# Patient Record
Sex: Male | Born: 2000 | Race: White | Hispanic: No | Marital: Single | Smoking: Never smoker
Health system: Southern US, Community
[De-identification: ages and names within clinical notes are randomized; demographics above are authoritative.]

## PROBLEM LIST (undated history)

## (undated) DIAGNOSIS — K589 Irritable bowel syndrome without diarrhea: Secondary | ICD-10-CM

## (undated) HISTORY — DX: Irritable bowel syndrome, unspecified: K58.9

## (undated) HISTORY — PX: APPENDECTOMY: SHX54

---
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
## 2022-01-18 ENCOUNTER — Institutional Professional Consult (permissible substitution): Payer: Medicaid Other | Admitting: Family

## 2022-01-20 ENCOUNTER — Ambulatory Visit (INDEPENDENT_AMBULATORY_CARE_PROVIDER_SITE_OTHER): Payer: Medicaid Other | Admitting: Family

## 2022-01-20 ENCOUNTER — Encounter: Payer: Self-pay | Admitting: Family

## 2022-01-20 VITALS — BP 118/72 | HR 68 | Resp 16 | Ht 71.0 in | Wt 200.6 lb

## 2022-01-20 DIAGNOSIS — F900 Attention-deficit hyperactivity disorder, predominantly inattentive type: Secondary | ICD-10-CM | POA: Diagnosis not present

## 2022-01-20 DIAGNOSIS — Z7189 Other specified counseling: Secondary | ICD-10-CM

## 2022-01-20 DIAGNOSIS — R4589 Other symptoms and signs involving emotional state: Secondary | ICD-10-CM | POA: Diagnosis not present

## 2022-01-20 DIAGNOSIS — Z79899 Other long term (current) drug therapy: Secondary | ICD-10-CM

## 2022-01-20 DIAGNOSIS — F411 Generalized anxiety disorder: Secondary | ICD-10-CM

## 2022-01-20 DIAGNOSIS — Z719 Counseling, unspecified: Secondary | ICD-10-CM

## 2022-01-20 MED ORDER — ATOMOXETINE HCL 10 MG PO CAPS: 10.0000 mg | ORAL_CAPSULE | Freq: Every day | ORAL | 2 refills | Status: DC

## 2022-01-20 NOTE — Patient Instructions (Addendum)
Strattera to start with 10 mg for 5 days, increase to 2 of the 10 mg capsules for 5 days and then increase to 3 of the 10 mg capsules. Before the medication is out call the office for the 40 mg dose. Take the medication at the same time at night.   [email or web address]
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
## 2022-01-21 ENCOUNTER — Encounter: Payer: Self-pay | Admitting: Family

## 2022-02-02 ENCOUNTER — Telehealth: Payer: Self-pay | Admitting: Family

## 2022-02-02 NOTE — Telephone Encounter (Signed)
  Emailed letter to [employer] ([email or web address] .[phone])

## 2022-02-04 ENCOUNTER — Other Ambulatory Visit: Payer: Self-pay

## 2022-02-04 MED ORDER — ATOMOXETINE HCL 40 MG PO CAPS: 40.0000 mg | ORAL_CAPSULE | Freq: Every day | ORAL | 1 refills | Status: DC

## 2022-02-04 NOTE — Telephone Encounter (Signed)
Strattera 40 mg daily # 30 with 1 RF's.Malena Peer for above e-scribed and sent to pharmacy on record  [id number] [place], [place] - [address] AT [facility] OF [facility] [address] [place] [address] Phone: [phone] Fax: [phone]

## 2022-04-16 ENCOUNTER — Other Ambulatory Visit: Payer: Self-pay | Admitting: Family

## 2022-04-18 NOTE — Telephone Encounter (Signed)
Strattera 40 mg daily, #30 with 1 RF's.RX for above e-scribed and sent to pharmacy on record  [id number] - [place], [place] - [address] AT [address] OF [address]  [place] [address] Phone: [phone] Fax: [phone]     

## 2022-05-26 ENCOUNTER — Telehealth (INDEPENDENT_AMBULATORY_CARE_PROVIDER_SITE_OTHER): Payer: Medicaid Other | Admitting: Family

## 2022-05-26 ENCOUNTER — Encounter: Payer: Self-pay | Admitting: Family

## 2022-05-26 DIAGNOSIS — F819 Developmental disorder of scholastic skills, unspecified: Secondary | ICD-10-CM

## 2022-05-26 DIAGNOSIS — Z719 Counseling, unspecified: Secondary | ICD-10-CM

## 2022-05-26 DIAGNOSIS — R278 Other lack of coordination: Secondary | ICD-10-CM

## 2022-05-26 DIAGNOSIS — Z79899 Other long term (current) drug therapy: Secondary | ICD-10-CM

## 2022-05-26 DIAGNOSIS — F900 Attention-deficit hyperactivity disorder, predominantly inattentive type: Secondary | ICD-10-CM

## 2022-05-26 DIAGNOSIS — F411 Generalized anxiety disorder: Secondary | ICD-10-CM

## 2022-05-26 DIAGNOSIS — Z7189 Other specified counseling: Secondary | ICD-10-CM

## 2022-05-26 DIAGNOSIS — R4589 Other symptoms and signs involving emotional state: Secondary | ICD-10-CM | POA: Diagnosis not present

## 2022-05-26 MED ORDER — SERTRALINE HCL 100 MG PO TABS: 100.0000 mg | ORAL_TABLET | Freq: Every day | ORAL | 2 refills | Status: DC

## 2022-05-26 NOTE — Progress Notes (Signed)
[place] [facility] [address], [place] [place] [address] Dept: [phone] Dept Fax: [phone]  Medication Check visit via Virtual Video   Patient ID:  Tyrone Carpenter  male DOB: [birth year]   21 y.o.   MRN: [id number]   DATE:05/26/22  PCP: Estrella Myrtle, MD  Virtual Visit via Video Note  I connected with  Tyrone Carpenter on 05/26/22 at  1:00 PM EDT by a video enabled telemedicine application and verified that I am speaking with the correct person using two identifiers. Patient/Parent Location: at work in his parked car.   I discussed the limitations, risks, security and privacy concerns of performing an evaluation and management service by telephone and the availability of in person appointments. I also discussed with the parents that there may be a patient responsible charge related to this service. The parents expressed understanding and agreed to proceed.  Provider: Carron Curie, NP  Location: work location.   HPI/CURRENT STATUS: Tyrone Carpenter is here for medication management of the psychoactive medications for ADHD and review of educational and behavioral concerns.   Tyrone Carpenter currently taking Strattera 40 mg and Zoloft 100 mg daily, which is working well. Takes medication daily as instructed. Medication tends to last for the time needed. Tyrone Carpenter is able to focus through school & work.   Tyrone Carpenter is eating well (eating breakfast, lunch and dinner). Tyrone Carpenter does not have appetite suppression and no changes reported.   Sleeping well (goes to bed at 2200-2300 wakes at 0600), sleeping through the night. Tyrone Carpenter does not have delayed sleep onset and getting plenty of sleep.   EDUCATION: School: [employer] with taking 2 classes right now Year/Grade:  college   Services: support class to start in October, disability services on campus Work: [employer] Hours: 7-2:00 pm on average (changes daily depending on night  before) Activities/ Exercise: intermittently and work has been busy, so he is staying active. Walking on occasion.   MEDICAL HISTORY: Individual Medical History/ Review of Systems: Yes, recent viral illness for a few days.  Has been healthy with no visits to the PCP. WCC due yearly.   Family Medical/ Social History: None reported Patient Lives with: grandparents  MENTAL HEALTH: Mental Health Issues: Anxiety less now with current medications with efficacy with symptom control.   Allergies: Allergies  Allergen Reactions   Amoxicillin Rash   Current Medications:  Current Outpatient Medications  Medication Instructions   atomoxetine (STRATTERA) 40 MG capsule TAKE 1 CAPSULE(40 MG) BY MOUTH DAILY   cetirizine (ZYRTEC) 10 MG tablet Oral   fluticasone (FLONASE) 50 MCG/ACT nasal spray 1 spray, Daily   pantoprazole (PROTONIX) 40 MG tablet Oral   sertraline (ZOLOFT) 100 mg, Oral, Daily  Medication Side Effects: None  DIAGNOSES:    ICD-10-CM   1. ADHD (attention deficit hyperactivity disorder), inattentive type  F90.0     2. Depressed mood  R45.89     3. Generalized anxiety disorder  F41.1     4. Learning difficulty  F81.9     5. Dysgraphia  R27.8     6. Medication management  Z79.899     7. Patient counseled  Z71.9     8. Goals of care, counseling/discussion  Z71.89      ASSESSMENT:     Tyrone Carpenter is a 21 year old male with a history of ADHD and Anxiety. He has been maintained on Strattera 40 mg and Zoloft 100 mg daily with good efficacy with no  side effects. Enrolled in [employer] with 2 main classes with 1 support class that starts in October. Attending 2 days/week for school. Has formal services in place and extra help when available, if needed. Working at [employer] working mostly mornings and 1 night shift weekly. No significant changes with eating, sleeping or health. Has been more active with work duties and lost some weight. No current need to change medications or dosing  today.  PLAN/RECOMMENDATIONS:  Updates with school and enrollment in 2 classes with 1 support class starting in October.  Spring semester will sign up in October and has to meet with disability services   Current routine for the day time with work and school discussed with good progress.  Tyrone Carpenter has current current disability services in place and has help available when needed.   Discussed work and scheduled days along with hours for school with requirements.  Eating less and staying more active with current work requirements.   Sleeping well with no problems but limited with working to "sleep in" mornings.  Medication management with current dosing and symptoms with good efficacy.   Counseled medication pharmacokinetics, options, dosage, administration, desired effects, and possible side effects.   Strattera 40 mg daily, no Rx today Zoloft changed to 100 mg daily, # 30 with 2 RF's RX for above e-scribed and sent to pharmacy on record  [place], [place] [address] Phone: [phone] Fax: [phone]  I discussed the assessment and treatment plan with the patient. The patient was provided an opportunity to ask questions and all were answered. The patient agreed with the plan and demonstrated an understanding of the instructions.   NEXT APPOINTMENT:  Visit date not found-f/u visit Telehealth OK  The patient was advised to call back or seek an in-person evaluation if the symptoms worsen or if the condition fails to improve as anticipated.  Carolann Littler, NP

## 2022-06-15 ENCOUNTER — Other Ambulatory Visit: Payer: Self-pay | Admitting: Family

## 2022-06-15 NOTE — Telephone Encounter (Signed)
Strattera 40 mg daily, #30 with 1 RF's.RX for above e-scribed and sent to pharmacy on record  [place], [place] [address] Phone: [phone] Fax: [phone]

## 2022-07-26 ENCOUNTER — Other Ambulatory Visit: Payer: Self-pay

## 2022-07-26 MED ORDER — ATOMOXETINE HCL 40 MG PO CAPS: ORAL_CAPSULE | ORAL | 1 refills | Status: DC

## 2022-07-26 MED ORDER — SERTRALINE HCL 100 MG PO TABS: 100.0000 mg | ORAL_TABLET | Freq: Every day | ORAL | 1 refills | Status: DC

## 2022-07-26 NOTE — Telephone Encounter (Signed)
Strattera 40 mg daily, #90 with 1 RF's and Zoloft 100 mg daily, #90 with 1 RF's.RX for above e-scribed and sent to pharmacy on record  [facility] [phone] [place], [place] - [address] [place] [address] Phone: [phone] Fax: [phone]

## 2022-08-14 ENCOUNTER — Other Ambulatory Visit: Payer: Self-pay | Admitting: Family

## 2022-09-13 ENCOUNTER — Telehealth: Payer: Self-pay

## 2022-09-13 ENCOUNTER — Institutional Professional Consult (permissible substitution): Payer: Self-pay | Admitting: Family

## 2022-09-13 NOTE — Telephone Encounter (Signed)
Patient not able to get off work for 09/13/2022 appointment with [facility]

## 2022-12-01 ENCOUNTER — Telehealth: Payer: Self-pay

## 2022-12-01 NOTE — Telephone Encounter (Signed)
Who's calling (name and relationship to patient) : Blanch Media; grandmother   Best contact number: [phone]  Provider they see:  Reason for call: Mom called and lvm. I called back to get [facility] scheduled. She wanted to know if we was able to fill Rx for Zoloft and his ADHD.   I did inform her that she would have to reach out to PCP. In which she stated that he is no longer there due to aging out, but will call them to see what they can do. She stated that she will call back if they are not able to help her.   Call ID:      PRESCRIPTION REFILL ONLY  Name of prescription:  Pharmacy:

## 2023-01-09 ENCOUNTER — Encounter (HOSPITAL_BASED_OUTPATIENT_CLINIC_OR_DEPARTMENT_OTHER): Payer: Self-pay | Admitting: Urology

## 2023-01-09 ENCOUNTER — Emergency Department (HOSPITAL_BASED_OUTPATIENT_CLINIC_OR_DEPARTMENT_OTHER)
Admission: EM | Admit: 2023-01-09 | Discharge: 2023-01-09 | Disposition: A | Discharge status: Home / Self Care | Payer: Medicaid Other | Attending: Emergency Medicine | Admitting: Emergency Medicine

## 2023-01-09 ENCOUNTER — Other Ambulatory Visit: Payer: Self-pay

## 2023-01-09 DIAGNOSIS — K921 Melena: Secondary | ICD-10-CM | POA: Insufficient documentation

## 2023-01-09 DIAGNOSIS — R195 Other fecal abnormalities: Secondary | ICD-10-CM

## 2023-01-09 LAB — COMPREHENSIVE METABOLIC PANEL
ALT: 16 U/L (ref 0–44)
AST: 20 U/L (ref 15–41)
Albumin: 4.8 g/dL (ref 3.5–5.0)
Alkaline Phosphatase: 78 U/L (ref 38–126)
Anion gap: 7 (ref 5–15)
BUN: 13 mg/dL (ref 6–20)
CO2: 28 mmol/L (ref 22–32)
Calcium: 8.9 mg/dL (ref 8.9–10.3)
Chloride: 103 mmol/L (ref 98–111)
Creatinine, Ser: 0.7 mg/dL (ref 0.61–1.24)
GFR, Estimated: >60 mL/min (ref >60)
Glucose, Bld: 88 mg/dL (ref 70–99)
Potassium: 3.2 mmol/L — ABNORMAL LOW (ref 3.5–5.1)
Sodium: 138 mmol/L (ref 135–145)
Total Bilirubin: 1.1 mg/dL (ref 0.3–1.2)
Total Protein: 8.1 g/dL (ref 6.5–8.1)

## 2023-01-09 LAB — CBC WITH DIFFERENTIAL/PLATELET
Abs Immature Granulocytes: 0.01 10*3/uL (ref 0.00–0.07)
Basophils Absolute: 0.1 10*3/uL (ref 0.0–0.1)
Basophils Relative: 2 %
Eosinophils Absolute: 0.1 10*3/uL (ref 0.0–0.5)
Eosinophils Relative: 2 %
HCT: 43.4 % (ref 39.0–52.0)
Hemoglobin: 15.2 g/dL (ref 13.0–17.0)
Immature Granulocytes: 0 %
Lymphocytes Relative: 29 %
Lymphs Abs: 1.4 10*3/uL (ref 0.7–4.0)
MCH: 29.1 pg (ref 26.0–34.0)
MCHC: 35 g/dL (ref 30.0–36.0)
MCV: 83.1 fL (ref 80.0–100.0)
Monocytes Absolute: 0.5 10*3/uL (ref 0.1–1.0)
Monocytes Relative: 10 %
Neutro Abs: 2.8 10*3/uL (ref 1.7–7.7)
Neutrophils Relative %: 57 %
Platelets: 275 10*3/uL (ref 150–400)
RBC: 5.22 MIL/uL (ref 4.22–5.81)
RDW: 11.9 % (ref 11.5–15.5)
WBC: 4.8 10*3/uL (ref 4.0–10.5)
nRBC: 0 % (ref 0.0–0.2)

## 2023-01-09 LAB — URINALYSIS, ROUTINE W REFLEX MICROSCOPIC
Bilirubin Urine: NEGATIVE
Glucose, UA: NEGATIVE mg/dL
Hgb urine dipstick: NEGATIVE
Ketones, ur: NEGATIVE mg/dL
Leukocytes,Ua: NEGATIVE
Nitrite: NEGATIVE
Protein, ur: NEGATIVE mg/dL
Specific Gravity, Urine: 1.02 (ref 1.005–1.030)
pH: 7 (ref 5.0–8.0)

## 2023-01-09 LAB — LIPASE, BLOOD: Lipase: 51 U/L (ref 11–51)

## 2023-01-09 LAB — OCCULT BLOOD X 1 CARD TO LAB, STOOL: Fecal Occult Bld: NEGATIVE

## 2023-01-09 NOTE — ED Triage Notes (Signed)
Pt states black stools x 2-3 days  Reports mild abdominal discomfort  States stool is formed  Denies N/V   States has lost 20-30 lb over the past month

## 2023-01-09 NOTE — Discharge Instructions (Addendum)
Note the workup today was overall reassuring.  As discussed, no evidence of blood and dark stool sample obtained.  Blood work looked reassuring.  Most likely secondary to Pepto-Bismol use.  Recommend follow-up with gastroenterology for reassessment of your symptoms.  Please do not hesitate to return to emergency department for worrisome signs or symptoms we discussed become apparent.

## 2023-01-09 NOTE — ED Provider Notes (Signed)
[place] [facility] Provider Note   CSN: [id number] Arrival date & time: 01/09/23  8295     History  Chief Complaint  Patient presents with   Melena    Tyrone Carpenter is a 22 y.o. male.  HPI   22 year old male presents emergency department with complaints of dark stool.  Patient states that he has been with consistent dark stool for the past 2 days.  Reports history of "GI bug" approximately 1 week ago..  States that he had symptoms of nausea and vomiting at that time.  States that he began to take Pepto-Bismol 2 days ago with subsequently noting dark stool.  Patient reports also weight loss over the past few months.  States has been working out more than normal without any change in diet.  He does state that he has noted some weight loss after been putting on atomoxetine approximately 1 year ago.  Reports some intermittent abdominal cramping type sensation of which she states he has had since he was "a child."  Denies any fever, chills, chest pain, shortness of breath, blood thinner use, urinary symptoms.  Past medical history significant for generalized anxiety disorder, ADHD  Home Medications Prior to Admission medications   Medication Sig Start Date End Date Taking? Authorizing Provider  atomoxetine (STRATTERA) 40 MG capsule TAKE 1 CAPSULE(40 MG) BY MOUTH DAILY 07/26/22   Paretta-Leahey, Miachel Roux, NP  cetirizine (ZYRTEC) 10 MG tablet Take by mouth.    [provider]  fluticasone (FLONASE) 50 MCG/ACT nasal spray Place 1 spray into both nostrils daily. Patient not taking: Reported on 09/30/2020 09/08/20   [provider]  pantoprazole (PROTONIX) 40 MG tablet Take by mouth. 02/18/20   [provider]  sertraline (ZOLOFT) 100 MG tablet Take 1 tablet (100 mg total) by mouth daily. 07/26/22   Paretta-Leahey, Miachel Roux, NP      Allergies    Amoxicillin    Review of Systems   Review of Systems  Physical Exam Updated Vital  Signs BP 118/87   Pulse 81   Temp 98 F (36.7 C) (Oral)   Resp 18   Ht 5\' 11"  (1.803 m)   Wt 72.1 kg   SpO2 100%   BMI 22.18 kg/m  Physical Exam Vitals and nursing note reviewed. Exam conducted with a chaperone present.  Constitutional:      General: He is not in acute distress.    Appearance: He is well-developed.  HENT:     Head: Normocephalic and atraumatic.  Eyes:     Conjunctiva/sclera: Conjunctivae normal.  Cardiovascular:     Rate and Rhythm: Normal rate and regular rhythm.     Heart sounds: No murmur heard. Pulmonary:     Effort: Pulmonary effort is normal. No respiratory distress.     Breath sounds: Normal breath sounds.  Abdominal:     Palpations: Abdomen is soft.     Tenderness: There is no abdominal tenderness.  Genitourinary:    Rectum: Guaiac result negative. No tenderness, external hemorrhoid or internal hemorrhoid. Normal anal tone.     Comments: No obvious hemorrhoid appreciated.  No obvious active bleeding.  Patient with stool sample obtained which was brown in appearance sent via Hemoccult which was negative. Musculoskeletal:        General: No swelling.     Cervical back: Neck supple.  Skin:    General: Skin is warm and dry.     Capillary Refill: Capillary refill takes less than 2 seconds.  Neurological:     Mental Status: He is alert.  Psychiatric:        Mood and Affect: Mood normal.     ED Results / Procedures / Treatments   Labs (all labs ordered are listed, but only abnormal results are displayed) Labs Reviewed  COMPREHENSIVE METABOLIC PANEL - Abnormal; Notable for the following components:      Result Value   Potassium 3.2 (*)    All other components within normal limits  CBC WITH DIFFERENTIAL/PLATELET  LIPASE, BLOOD  URINALYSIS, ROUTINE W REFLEX MICROSCOPIC  OCCULT BLOOD X 1 CARD TO LAB, STOOL    EKG None  Radiology No results found.  Procedures Procedures    Medications Ordered in ED Medications - No data to  display  ED Course/ Medical Decision Making/ A&P                             Medical Decision Making Amount and/or Complexity of Data Reviewed Labs: ordered.   This patient presents to the ED for concern of dark stool, this involves an extensive number of treatment options, and is a complaint that carries with it a high risk of complications and morbidity.  The differential diagnosis includes upper/lower GI bleed, medication side effect   Co morbidities that complicate the patient evaluation  See HPI   Additional history obtained:  Additional history obtained from EMR External records from outside source obtained and reviewed including hospital records   Lab Tests:  I Ordered, and personally interpreted labs.  The pertinent results include: No leukocytosis.  No evidence of anemia.  Platelets within normal range.  Mild hypokalemia 3.2 but otherwise, electrolytes within normal limits.  No transaminitis.  No renal dysfunction.  UA without abnormality.  Lipase within normal limits.  Occult sample negative.   Imaging Studies ordered:  N/a   Cardiac Monitoring: / EKG:  The patient was maintained on a cardiac monitor.  I personally viewed and interpreted the cardiac monitored which showed an underlying rhythm of: Sinus rhythm   Consultations Obtained:  N/a   Problem List / ED Course / Critical interventions / Medication management  Dark stool Reevaluation of the patient showed that the patient stayed the same I have reviewed the patients home medicines and have made adjustments as needed   Social Determinants of Health:  Denies tobacco, licit drug use   Test / Admission - Considered:  Dark stool Vitals signs within normal range and stable throughout visit. Laboratory studies significant for: See above 22 year old male presents emergency department with complaints of dark stool.  Patient without appreciable melena on exam or bright red blood with negative  Hemoccult.  Dark appearing stools most likely secondary to Pepto-Bismol use at home.  Patient without any abdominal pain currently with reassuring laboratory studies so CT imaging of the abdomen foregone at this time.  Regarding patient's perceived weight loss, could be secondary to increased physical activity.  Given history of intermittent diffuse abdominal discomfort, will recommend follow-up with gastroenterology for further assessment.  Will recommend follow-up with primary care for further assessment of patient's weight loss.  Further workup deemed unnecessary at this time while emergency department.  Treatment plan discussed at length with patient and he acknowledged understanding was agreeable to said plan. Worrisome signs and symptoms were discussed with the patient, and the patient acknowledged understanding to return to the ED if noticed. Patient was stable upon discharge.  Final Clinical Impression(s) / ED Diagnoses Final diagnoses:  Dark stools    Rx / DC Orders ED Discharge Orders     None         Peter Garter, [place] 01/09/23 1554    Melene Plan, DO 01/10/23 [phone]

## 2023-02-15 ENCOUNTER — Telehealth (INDEPENDENT_AMBULATORY_CARE_PROVIDER_SITE_OTHER): Payer: Self-pay | Admitting: Child and Adolescent Psychiatry

## 2023-02-15 NOTE — Telephone Encounter (Signed)
  Name of who is calling: Lorina Rabon Relationship to Patient: grandma  Best contact number: [phone]  Provider they see: Blanche East  Reason for call: [facility] has an appt pn 6/18 gma needs to speak to someone about the appt cause she needs some questions answered. Please follow up      PRESCRIPTION REFILL ONLY  Name of prescription:  Pharmacy:

## 2023-02-15 NOTE — Telephone Encounter (Signed)
Lvm for grandmother to return call to answer questions about appointment

## 2023-02-15 NOTE — Telephone Encounter (Signed)
Lvm again with the 3rd attempt to contact grandmother

## 2023-02-15 NOTE — Telephone Encounter (Signed)
Grandmother returning missed call from cma, please follow up.Call back to [phone]

## 2023-02-16 NOTE — Telephone Encounter (Signed)
Spoke to gma about the 22 yr being seen in our care and she will go to his primary first and then let us know know if she decides to keep the appointment for next week with [place]

## 2023-02-20 ENCOUNTER — Encounter: Payer: Self-pay | Admitting: Family Medicine

## 2023-02-20 ENCOUNTER — Ambulatory Visit (INDEPENDENT_AMBULATORY_CARE_PROVIDER_SITE_OTHER): Payer: Medicaid Other | Admitting: Family Medicine

## 2023-02-20 VITALS — BP 104/68 | HR 115 | Temp 98.0°F | Ht 71.0 in | Wt 161.0 lb

## 2023-02-20 DIAGNOSIS — Z7689 Persons encountering health services in other specified circumstances: Secondary | ICD-10-CM | POA: Diagnosis not present

## 2023-02-20 DIAGNOSIS — F411 Generalized anxiety disorder: Secondary | ICD-10-CM | POA: Diagnosis not present

## 2023-02-20 DIAGNOSIS — E739 Lactose intolerance, unspecified: Secondary | ICD-10-CM | POA: Diagnosis not present

## 2023-02-20 DIAGNOSIS — F900 Attention-deficit hyperactivity disorder, predominantly inattentive type: Secondary | ICD-10-CM | POA: Diagnosis not present

## 2023-02-20 DIAGNOSIS — Z1339 Encounter for screening examination for other mental health and behavioral disorders: Secondary | ICD-10-CM

## 2023-02-20 NOTE — Assessment & Plan Note (Signed)
Continue current medications.  Is on atomoaxetine and sertraline. Tolerating well.

## 2023-02-20 NOTE — Assessment & Plan Note (Signed)
Discussed lactose avoidance and dairy alternatives.

## 2023-02-20 NOTE — Patient Instructions (Signed)
It was nice to meet you today,  I can send in those refills for your current medications when you need them.  Just call our office when you need a refill and we will send it in.  I will send in the referral.  If he has not heard from anybody within 2 weeks, please call our office to make sure the referral went through.  Have a great day,  Frederic Jericho, MD

## 2023-02-20 NOTE — Assessment & Plan Note (Signed)
On atomoxetine.  Also on ssri for anxiety.  No reported side effects.  Advised pt we can refill these medications if needed.  Will send in referral to adult psych now that patient has aged out of pediatric specialist's care.

## 2023-02-20 NOTE — Progress Notes (Signed)
New Patient Office Visit  Subjective    Patient ID: Tyrone Carpenter, male    DOB: June 20, 2001  Age: 22 y.o. MRN: [id number]  CC:  Chief Complaint  Patient presents with   New Patient (Initial Visit)    Estab care    HPI Tyrone Carpenter presents to establish care Patient was previously seeing a pediatrician.  He is accompanied today by his grandmother with whom he lives.  Patient's medications included atomoxetine and sertraline.  He was taking these for her ADHD and generalized anxiety disorder.  He was prescribed this by a pediatric specialist approximately 1 year ago.  He feels like these medications are working for him.  Feels like he can concentrate better and also feels like his anxiety is improved.  He and his grandmother are concerned about who will prescribe these medications in the future.  We discussed having [place] prescribe them versus seeing a specialist in adult psychiatry.  He has had an appointment scheduled with a new specialist but this person is also pediatric specialist  Patient has brothers and sisters but they do not live with him.  Patient is currently sexually active with 1 male partner.  Uses condoms.  Not interested in STI testing at this time.  Patient is lactose intolerant.  Has stopped consuming milk and his GI symptoms improved significantly since then.  Was previously taking PPIs.  Not taking this currently.  Does take a generic Claritin for allergies.  No issues with that.   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   loratadine (CLARITIN) 10 MG tablet Take 10 mg by mouth daily.   sertraline (ZOLOFT) 100 MG tablet Take 1 tablet (100 mg total) by mouth daily.   [DISCONTINUED] cetirizine (ZYRTEC) 10 MG tablet Take by mouth. (Patient not taking: Reported on 02/20/2023)   [DISCONTINUED] pantoprazole (PROTONIX) 40 MG tablet Take by mouth. (Patient not taking: Reported on 02/20/2023)   No facility-administered encounter medications on file as of 02/20/2023.    History reviewed. No pertinent past medical history.  Past Surgical History:  Procedure Laterality Date   APPENDECTOMY      History reviewed. No pertinent family history.  Social History   Socioeconomic History   Marital status: Single    Spouse name: Not on file   Number of children: Not on file   Years of education: Not on file   Highest education level: Not on file  Occupational History   Not on file  Tobacco Use   Smoking status: Never   Smokeless tobacco: Not on file  Substance and Sexual Activity   Alcohol use: No   Drug use: Never   Sexual activity: Not on file  Other Topics Concern   Not on file  Social History Narrative   Not on file   Social Determinants of Health   Financial Resource Strain: Not on file  Food Insecurity: Not on file  Transportation Needs: Not on file  Physical Activity: Not on file  Stress: Not on file  Social Connections: Not on file  Intimate Partner Violence: Not on file    ROS      Objective    BP 104/68   Pulse (!) 115   Temp 98 F (36.7 C) (Oral)   Ht 5\' 11"  (1.803 m)   Wt 161 lb (73 kg)   SpO2 98%   BMI 22.45 kg/m   Physical Exam General: Alert, oriented.  No acute distress HEENT: PERRLA, EOMI CV: Regular rate and rhythm Pulmonary: Lungs clear bilaterally GI: Soft, nontender Extremities: No pedal edema Psych: Pleasant affect, maintains good eye contact, spontaneous speech.      Assessment & Plan:   Problem List Items Addressed This Visit       Other    ADHD (attention deficit hyperactivity disorder), inattentive type    On atomoxetine.  Also on ssri for anxiety.  No reported side effects.  Advised pt we can refill these medications if needed.  Will send in referral to adult psych now that patient has aged out of pediatric specialist's care.       Generalized anxiety disorder    Continue current medications.  Is on atomoaxetine and sertraline. Tolerating well.        Lactose intolerance    Discussed lactose avoidance and dairy alternatives.        Other Visit Diagnoses     Encounter to establish care    -  Primary   ADHD (attention deficit hyperactivity disorder) evaluation       Relevant Orders   Ambulatory referral to Psychiatry       Return in about 1 year (around 02/20/2024) for Physical.   Sandre Kitty, MD

## 2023-02-21 ENCOUNTER — Ambulatory Visit (INDEPENDENT_AMBULATORY_CARE_PROVIDER_SITE_OTHER): Payer: Medicaid Other | Admitting: Child and Adolescent Psychiatry

## 2023-04-04 ENCOUNTER — Other Ambulatory Visit: Payer: Self-pay | Admitting: Family Medicine

## 2023-04-04 MED ORDER — ATOMOXETINE HCL 40 MG PO CAPS: ORAL_CAPSULE | ORAL | 1 refills | Status: DC

## 2023-04-11 ENCOUNTER — Other Ambulatory Visit: Payer: Self-pay | Admitting: Family Medicine

## 2023-08-10 ENCOUNTER — Other Ambulatory Visit: Payer: Self-pay | Admitting: Family Medicine

## 2023-08-10 ENCOUNTER — Telehealth: Payer: Self-pay

## 2023-08-10 MED ORDER — ATOMOXETINE HCL 40 MG PO CAPS: ORAL_CAPSULE | ORAL | 1 refills | Status: DC

## 2023-08-10 MED ORDER — SERTRALINE HCL 100 MG PO TABS: 100.0000 mg | ORAL_TABLET | Freq: Every day | ORAL | 1 refills | Status: DC

## 2023-08-10 NOTE — Telephone Encounter (Signed)
Copied from CRM [id number]. Topic: Clinical - Medication Refill >> Aug 10, 2023  1:28 PM Prudencio Pair wrote: Most Recent Primary Care Visit:  Provider: Sandre Kitty  Department: [facility]  Visit Type: NEW PATIENT  Date: 02/20/2023  Medication: Atomoxetine 40 MG Capsule & Setraline 100mg    Has the patient contacted their pharmacy? Yes, pharmacy states they haven't received new refill requests.  (Agent: If no, request that the patient contact the pharmacy for the refill. If patient does not wish to contact the pharmacy document the reason why and proceed with request.) (Agent: If yes, when and what did the pharmacy advise?)  Is this the correct pharmacy for this prescription? Yes If no, delete pharmacy and type the correct one.  This is the patient's preferred pharmacy:  [facility] [phone] [place], [place] - [address]  [place] [address] Phone: [phone] Fax: [phone]     Has the prescription been filled recently? No  Is the patient out of the medication? Yes  Has the patient been seen for an appointment in the last year OR does the patient have an upcoming appointment? Yes  Can we respond through MyChart? Yes  Agent: Please be advised that Rx refills may take up to 3 business days. We ask that you follow-up with your pharmacy.

## 2023-08-10 NOTE — Telephone Encounter (Signed)
I sent in the refills for the medications.  I had already sent in a referral.  It appears it was denied, I do not see the reason for denial.  We may need to resend the referral to someone else.

## 2023-08-10 NOTE — Telephone Encounter (Signed)
Copied from CRM [id number]. Topic: Referral - Request for Referral >> Aug 10, 2023  1:32 PM Prudencio Pair wrote: Did the patient discuss referral with their provider in the last year? Yes, pt's grandmother stated that Dr. Constance Goltz told her he would recommend a psychologist. (If No - schedule appointment) (If Yes - send message)  Appointment offered? No  Type of order/referral and detailed reason for visit: Psychologist recommendation  Preference of office, provider, location: N/A  If referral order, have you been seen by this specialty before? No (If Yes, this issue or another issue? When? Where?  Can we respond through MyChart? Yes

## 2023-08-14 NOTE — Telephone Encounter (Signed)
Contacted pt grandmother and provided her with the number to the office pt was referred to to schedule appointment.

## 2023-08-15 ENCOUNTER — Telehealth: Payer: Self-pay | Admitting: *Deleted

## 2023-08-15 NOTE — Telephone Encounter (Signed)
Contacted pts grandmother to discuss this and she is going to call the original place where the referral was sent and see about getting an appointment and if they are not able to help with what she /pt is looking for then we will try some place different.   Copied from CRM [id number]. Topic: Referral - Question >> Aug 14, 2023  1:51 PM Tyrone Carpenter wrote: Reason for CRM: Pts grandmother called in stating pt received referrals for psychiatrists and wanted referrals for psychologists

## 2023-08-22 ENCOUNTER — Ambulatory Visit (INDEPENDENT_AMBULATORY_CARE_PROVIDER_SITE_OTHER): Payer: Medicaid Other | Admitting: Family Medicine

## 2023-08-22 ENCOUNTER — Encounter: Payer: Self-pay | Admitting: Family Medicine

## 2023-08-22 VITALS — BP 128/70 | HR 225 | Temp 98.8°F | Resp 22 | Ht 71.0 in | Wt 177.0 lb

## 2023-08-22 DIAGNOSIS — R110 Nausea: Secondary | ICD-10-CM

## 2023-08-22 DIAGNOSIS — R1013 Epigastric pain: Secondary | ICD-10-CM

## 2023-08-22 MED ORDER — ONDANSETRON HCL 8 MG PO TABS: 8.0000 mg | ORAL_TABLET | Freq: Three times a day (TID) | ORAL | 0 refills | Status: AC | PRN

## 2023-08-22 MED ORDER — PANTOPRAZOLE SODIUM 40 MG PO TBEC: 40.0000 mg | DELAYED_RELEASE_TABLET | Freq: Every day | ORAL | 1 refills | Status: DC

## 2023-08-22 NOTE — Patient Instructions (Addendum)
I would like to check your lab work as soon as possible to make sure that we are not missing any electrolyte abnormalities, gallbladder issues, or pancreas issues.  They will help you schedule this on your way out.  Start pantoprazole once a day.  This is the medicine that decreases how much acid your stomach is making.  Start ondansetron to help with nausea.  It would be important that you are able to eat and drink to ensure that you are not getting dehydrated.  Please monitor your temperature and let us know immediately if you do develop a fever.

## 2023-08-22 NOTE — Progress Notes (Signed)
Acute Office Visit  Subjective:     Patient ID: Tyrone Carpenter, male    DOB: 07/07/2001, 22 y.o.   MRN: [id number]  Chief Complaint  Patient presents with   Abdominal Pain    Started 4-5 days go. Ate at [employer] on 08/16/23. Emesis several hours later.   Irritable Bowel Syndrome    HPI Patient is in today for abdominal pain.  He states that 6 days ago, he ate at [employer].  He believes he developed food poisoning having vomiting and diarrhea for approximately 12 hours afterward that then resolved.  Later that next day, he developed upper abdominal pressure.  For the past 4-5 days he has been experiencing upper abdominal pressure more than pain which has made it difficult to eat or drink anything.  He describes this as a "weight" in his upper abdomen.  The pain comes in waves, currently 10/10.  He developed a fever 2 days ago which has since resolved.  He has a known history of IBS and possible acid reflux.  At home, he has taken Mucinex, Pepto-Bismol, ibuprofen/Tylenol with minimal improvement.  He notes that he has also been experiencing a cough and belching.  Denies hematochezia, dark stools.  He notes that the abdominal pressure can be in all areas of his abdomen and often extends up through his throat as well.  He has ongoing nausea that has made it difficult to eat or drink anything for the past several days.  Denies chest pain, palpitations, shortness of breath, urinary symptoms.  ROS See HPI    Objective:    BP 128/70 (BP Location: Left Arm, Patient Position: Sitting, Cuff Size: Normal)   Pulse (!) 225   Temp 98.8 F (37.1 C) (Oral)   Resp (!) 22   Ht 5\' 11"  (1.803 m)   Wt 177 lb (80.3 kg)   SpO2 94%   BMI 24.69 kg/m   Physical Exam Constitutional:      General: He is not in acute distress.    Appearance: Normal appearance.  HENT:     Head: Normocephalic and atraumatic.  Cardiovascular:     Rate and Rhythm: Normal rate and regular rhythm.     Heart sounds: Normal heart  sounds. No murmur heard.    No friction rub. No gallop.  Pulmonary:     Effort: Pulmonary effort is normal. No respiratory distress.     Breath sounds: Normal breath sounds. No wheezing, rhonchi or rales.  Abdominal:     General: Abdomen is flat. Bowel sounds are normal. There is no distension.     Palpations: Abdomen is soft. There is no hepatomegaly or splenomegaly.     Tenderness: There is generalized abdominal tenderness. There is no guarding or rebound. Negative signs include Murphy's sign and McBurney's sign.  Skin:    General: Skin is warm and dry.  Neurological:     Mental Status: He is alert and oriented to person, place, and time.  Psychiatric:        Mood and Affect: Mood normal.       Assessment & Plan:  Nausea -     Ondansetron HCl; Take 1 tablet (8 mg total) by mouth every 8 (eight) hours as needed for nausea or vomiting.  Dispense: 20 tablet; Refill: 0  Epigastric pain -     Pantoprazole Sodium; Take 1 tablet (40 mg total) by mouth daily.  Dispense: 60 tablet; Refill: 1 -     CBC with Differential/Platelet; Future -  Comprehensive metabolic panel; Future -     Lipase  Broad differential including peptic ulcer, GERD, IBS flare, gallbladder disease, pancreatitis. Checking CBC, CMP, lipase and initiating trial of pantoprazole 40 mg daily for 8 weeks to start. Also provided ondansetron to reduce nausea, discussed importance of maintaining hydration. Afebrile today, advised to present to ER for severe abdominal pain or fever.  Return in about 2 months (around 10/23/2023) for follow-up for abdominal pain and possible PUD/GERD.  Melida Quitter, PA

## 2023-08-23 ENCOUNTER — Telehealth: Payer: Self-pay

## 2023-08-23 NOTE — Telephone Encounter (Signed)
Copied from CRM [id number]. Topic: Appointments - Appointment Scheduling >> Aug 22, 2023  5:14 PM Tiffany H wrote: Patient/patient representative is calling to schedule an appointment. Refer to attachments for appointment information. Patient will be coming in tomorrow to have lab work done.

## 2023-08-24 LAB — CBC WITH DIFFERENTIAL/PLATELET
Basophils Absolute: 0.1 10*3/uL (ref 0.0–0.2)
Basos: 1 %
EOS (ABSOLUTE): 0.1 10*3/uL (ref 0.0–0.4)
Eos: 2 %
Hematocrit: 45.3 % (ref 37.5–51.0)
Hemoglobin: 15.5 g/dL (ref 13.0–17.7)
Immature Grans (Abs): 0 10*3/uL (ref 0.0–0.1)
Immature Granulocytes: 0 %
Lymphocytes Absolute: 2.1 10*3/uL (ref 0.7–3.1)
Lymphs: 30 %
MCH: 29.6 pg (ref 26.6–33.0)
MCHC: 34.2 g/dL (ref 31.5–35.7)
MCV: 87 fL (ref 79–97)
Monocytes Absolute: 0.8 10*3/uL (ref 0.1–0.9)
Monocytes: 12 %
Neutrophils Absolute: 3.7 10*3/uL (ref 1.4–7.0)
Neutrophils: 55 %
Platelets: 320 10*3/uL (ref 150–450)
RBC: 5.24 x10E6/uL (ref 4.14–5.80)
RDW: 12 % (ref 11.6–15.4)
WBC: 6.9 10*3/uL (ref 3.4–10.8)

## 2023-08-24 LAB — COMPREHENSIVE METABOLIC PANEL
ALT: 11 [IU]/L (ref 0–44)
AST: 16 [IU]/L (ref 0–40)
Albumin: 4.9 g/dL (ref 4.3–5.2)
Alkaline Phosphatase: 88 [IU]/L (ref 51–125)
BUN/Creatinine Ratio: 13 (ref 9–20)
BUN: 12 mg/dL (ref 6–20)
Bilirubin Total: 1 mg/dL (ref 0.0–1.2)
CO2: 22 mmol/L (ref 20–29)
Calcium: 9.7 mg/dL (ref 8.7–10.2)
Chloride: 99 mmol/L (ref 96–106)
Creatinine, Ser: 0.96 mg/dL (ref 0.76–1.27)
Globulin, Total: 2.4 g/dL (ref 1.5–4.5)
Glucose: 101 mg/dL — ABNORMAL HIGH (ref 70–99)
Potassium: 3.9 mmol/L (ref 3.5–5.2)
Sodium: 137 mmol/L (ref 134–144)
Total Protein: 7.3 g/dL (ref 6.0–8.5)
eGFR: 116 mL/min/{1.73_m2} (ref >59)

## 2023-08-24 LAB — LIPASE: Lipase: 27 U/L (ref 13–78)

## 2023-08-25 ENCOUNTER — Ambulatory Visit: Payer: Self-pay | Admitting: Family Medicine

## 2023-08-25 NOTE — Telephone Encounter (Signed)
Chief Complaint: diarrhea  Symptoms: diarrhea, vomiting, abdominal pain, dizziness Frequency: since last week Pertinent Negatives: Patient denies CP, SOB Disposition: [x] ED /[] Urgent Care (no appt availability in office) / [] Appointment(In office/virtual)/ []  [facility]/ [] Home Care/ [] Refused Recommended Disposition /[] [facility]/ []  Follow-up with PCP Additional Notes: pt with hx of IBS-c/o diarrhea, vomiting, abdominal pain and dizziness. Patient states pain feels different then his IBS along with the diarrhea being different than normal. Patient states he hasn't felt well today. Endorses bouts of dizziness along with not being able to take in much oral fluid. Patient endorses no appetite. Per protocol, the recommendation for the ED is given. Patient verbalizes understanding of plan. All questions answered.   Copied from CRM [id number]. Topic: Clinical - Pink Word Triage >> Aug 25, 2023  4:22 PM Joanette Gula wrote: Reason for Triage: DIARRHEA Reason for Disposition  [1] Drinking very little AND [2] dehydration suspected (e.g., no urine > 12 hours, very dry mouth, very lightheaded)  Answer Assessment - Initial Assessment Questions 1. DIARRHEA SEVERITY: "How bad is the diarrhea?" "How many more stools have you had in the past 24 hours than normal?"    - NO DIARRHEA (SCALE 0)   - MILD (SCALE 1-3): Few loose or mushy BMs; increase of 1-3 stools over normal daily number of stools; mild increase in ostomy output.   -  MODERATE (SCALE 4-7): Increase of 4-6 stools daily over normal; moderate increase in ostomy output.   -  SEVERE (SCALE 8-10; OR "WORST POSSIBLE"): Increase of 7 or more stools daily over normal; moderate increase in ostomy output; incontinence.     Moderate 2. ONSET: "When did the diarrhea begin?"      About a week ago 3. BM CONSISTENCY: "How loose or watery is the diarrhea?"      With having IBS, going small amounts but several times 4. VOMITING: "Are you also  vomiting?" If Yes, ask: "How many times in the past 24 hours?"      Yes-vomited this morning 5. ABDOMEN PAIN: "Are you having any abdomen pain?" If Yes, ask: "What does it feel like?" (e.g., crampy, dull, intermittent, constant)      Yes-hard to describe 6. ABDOMEN PAIN SEVERITY: If present, ask: "How bad is the pain?"  (e.g., Scale 1-10; mild, moderate, or severe)   - MILD (1-3): doesn't interfere with normal activities, abdomen soft and not tender to touch    - MODERATE (4-7): interferes with normal activities or awakens from sleep, abdomen tender to touch    - SEVERE (8-10): excruciating pain, doubled over, unable to do any normal activities       7 out of 10 but intermittent in nature.  7. ORAL INTAKE: If vomiting, "Have you been able to drink liquids?" "How much liquids have you had in the past 24 hours?"     Is able to keep fluids down but having difficulty 8. HYDRATION: "Any signs of dehydration?" (e.g., dry mouth [not just dry lips], too weak to stand, dizziness, new weight loss) "When did you last urinate?"     Having episodes of dizziness 9. EXPOSURE: "Have you traveled to a foreign country recently?" "Have you been exposed to anyone with diarrhea?" "Could you have eaten any food that was spoiled?"     No 10. ANTIBIOTIC USE: "Are you taking antibiotics now or have you taken antibiotics in the past 2 months?"       no 11. OTHER SYMPTOMS: "Do you have any other  symptoms?" (e.g., fever, blood in stool)       Felt very dizzy at times. Darker urine, low grade fevers  Protocols used: Diarrhea-A-AH

## 2023-10-16 ENCOUNTER — Telehealth: Payer: Self-pay | Admitting: *Deleted

## 2023-10-16 NOTE — Telephone Encounter (Signed)
 Copied from CRM [id number]. Topic: Referral - Request for Referral >> Oct 16, 2023  2:20 PM Felizardo Hotter wrote: Did the patient discuss referral with their provider in the last year? Yes (If No - schedule appointment) (If Yes - send message)  Appointment offered? Yes  Type of order/referral and detailed reason for visit: Referral# [id number] Grandmother stated they closed down, please redirect referral  Preference of office, provider, location: In patient's area  If referral order, have you been seen by this specialty before? Yes (If Yes, this issue or another issue? When? Where?  Can we respond through MyChart? Yes

## 2023-10-16 NOTE — Telephone Encounter (Signed)
 LVM for grandmother to call to get more information, patient was not familiar with what was being requested.

## 2023-10-16 NOTE — Telephone Encounter (Signed)
 Pt grandmother called back and I provided her with the number to the place the pt was referred to in June of 2024.  She said that he is now ready to go there.

## 2023-11-02 ENCOUNTER — Encounter (INDEPENDENT_AMBULATORY_CARE_PROVIDER_SITE_OTHER): Payer: Self-pay

## 2024-01-02 ENCOUNTER — Ambulatory Visit: Payer: Self-pay

## 2024-01-02 NOTE — Telephone Encounter (Signed)
 Copied from CRM [id number]. Topic: Appointments - Appointment Scheduling >> Jan 02, 2024 10:48 AM Lorenz Romano B wrote: Patient/patient representative is calling to schedule an appointment. Refer to attachments for appointment information.  Patient has a sore throat and it has been like that for several days   Chief Complaint: Sore throat, Productive cough Symptoms: Sore throat, Productive cough with green mucous Frequency: x 2 days Pertinent Negatives: Patient denies fever, coughing up blood, difficulty breathing Disposition: [] ED /[x] Urgent Care (no appt availability in office) / [] Appointment(In office/virtual)/ []  [facility]/ [] Home Care/ [] Refused Recommended Disposition /[] [facility]/ []  Follow-up with PCP Additional Notes: Patient called and advised that his throat has been sore for two days.  He also has had a productive cough for two days that sometimes producing green mucous. Patient denies any difficulty breathing, fever, or coughing up blood. There are no available appointments in patient's PCP office until the end of June. Patient is advised that Urgent Care is a good alternative since his PCP office didn't have any available openings anytime soon. Patient states that there is a nearby Urgent Care that he can go to. Patient is also advised that if anything worsens, the Emergency Room is always there. Patient verbalized understanding.  Reason for Disposition  [1] Continuous (nonstop) coughing interferes with work or school AND [2] no improvement using cough treatment per Care Advice  Answer Assessment - Initial Assessment Questions 1. ONSET: "When did the throat start hurting?" (Hours or days ago)      2 days ago 2. SEVERITY: "How bad is the sore throat?" (Scale 1-10; mild, moderate or severe)   - MILD (1-3):  Doesn't interfere with eating or normal activities.   - MODERATE (4-7): Interferes with eating some solids and normal activities.   - SEVERE (8-10):   Excruciating pain, interferes with most normal activities.   - SEVERE WITH DYSPHAGIA (10): Can't swallow liquids, drooling.     6 3. STREP EXPOSURE: "Has there been any exposure to strep within the past week?" If Yes, ask: "What type of contact occurred?"      --- 4.  VIRAL SYMPTOMS: "Are there any symptoms of a cold, such as a runny nose, cough, hoarse voice or red eyes?"      cough 5. FEVER: "Do you have a fever?" If Yes, ask: "What is your temperature, how was it measured, and when did it start?"     No 6. PUS ON THE TONSILS: "Is there pus on the tonsils in the back of your throat?"     --- 7. OTHER SYMPTOMS: "Do you have any other symptoms?" (e.g., difficulty breathing, headache, rash)     Cough--green mucous sometimes  Answer Assessment - Initial Assessment Questions 1. ONSET: "When did the cough begin?"      2 days ago 2. SEVERITY: "How bad is the cough today?"      Pretty aggravating and has made his voice hoarse 3. SPUTUM: "Describe the color of your sputum" (none, dry cough; clear, white, yellow, green)     Sometimes green 4. HEMOPTYSIS: "Are you coughing up any blood?" If so ask: "How much?" (flecks, streaks, tablespoons, etc.)     No 5. DIFFICULTY BREATHING: "Are you having difficulty breathing?" If Yes, ask: "How bad is it?" (e.g., mild, moderate, severe)    - MILD: No SOB at rest, mild SOB with walking, speaks normally in sentences, can lie down, no retractions, pulse < 100.    - MODERATE: SOB at rest,  SOB with minimal exertion and prefers to sit, cannot lie down flat, speaks in phrases, mild retractions, audible wheezing, pulse 100-120.    - SEVERE: Very SOB at rest, speaks in single words, struggling to breathe, sitting hunched forward, retractions, pulse > 120      No 6. FEVER: "Do you have a fever?" If Yes, ask: "What is your temperature, how was it measured, and when did it start?"     No 7. CARDIAC HISTORY: "Do you have any history of heart disease?" (e.g., heart  attack, congestive heart failure)      No 8. LUNG HISTORY: "Do you have any history of lung disease?"  (e.g., pulmonary embolus, asthma, emphysema)     No 9. PE RISK FACTORS: "Do you have a history of blood clots?" (or: recent major surgery, recent prolonged travel, bedridden)     No 10. OTHER SYMPTOMS: "Do you have any other symptoms?" (e.g., runny nose, wheezing, chest pain)       Sore throat 12. TRAVEL: "Have you traveled out of the country in the last month?" (e.g., travel history, exposures)       No  Protocols used: Sore Throat-A-AH, Cough - Acute Productive-A-AH

## 2024-01-08 ENCOUNTER — Other Ambulatory Visit: Payer: Self-pay | Admitting: Family Medicine

## 2024-01-08 DIAGNOSIS — R1013 Epigastric pain: Secondary | ICD-10-CM

## 2024-01-12 ENCOUNTER — Telehealth: Payer: Self-pay | Admitting: *Deleted

## 2024-01-12 NOTE — Telephone Encounter (Unsigned)
 Copied from CRM [id number]. Topic: General - Other >> Jan 12, 2024  1:59 PM Emylou G wrote: Reason for CRM: Chriss Coup Key called her number [phone].. needs a referral for behavorial health.. for medication management - needs to covered by his insurance:  [facility] [phone] is who she found.Aaron Aas

## 2024-01-14 ENCOUNTER — Other Ambulatory Visit: Payer: Self-pay | Admitting: Family Medicine

## 2024-01-14 DIAGNOSIS — F411 Generalized anxiety disorder: Secondary | ICD-10-CM

## 2024-01-14 DIAGNOSIS — F900 Attention-deficit hyperactivity disorder, predominantly inattentive type: Secondary | ICD-10-CM

## 2024-01-14 NOTE — Telephone Encounter (Signed)
 Referral has been placed.

## 2024-01-15 ENCOUNTER — Telehealth: Payer: Self-pay | Admitting: *Deleted

## 2024-01-15 NOTE — Telephone Encounter (Signed)
 Pt grandmother informed that this has been updated.

## 2024-01-15 NOTE — Telephone Encounter (Signed)
 Copied from CRM [id number]. Topic: General - Other >> Jan 12, 2024  1:59 PM Emylou G wrote: Reason for CRM: Chriss Coup Key called her number [phone].. needs a referral for behavorial health.. for medication management - needs to covered by his insurance:  [employer] [phone] is who she found.. >> Jan 15, 2024  1:38 PM Emylou G wrote: Patient Grandfather called Daneen Dunk.Aaron Aas would like more details on the referral ( who it will be ) number on file is good

## 2024-01-27 ENCOUNTER — Other Ambulatory Visit: Payer: Self-pay | Admitting: Family Medicine

## 2024-02-12 NOTE — Telephone Encounter (Signed)
 This was updated and resent to different location.

## 2024-02-21 ENCOUNTER — Ambulatory Visit (INDEPENDENT_AMBULATORY_CARE_PROVIDER_SITE_OTHER): Payer: Medicaid Other | Admitting: Family Medicine

## 2024-02-21 ENCOUNTER — Encounter: Payer: Self-pay | Admitting: Family Medicine

## 2024-02-21 VITALS — BP 108/74 | HR 94 | Ht 71.0 in | Wt 161.1 lb

## 2024-02-21 DIAGNOSIS — Z0000 Encounter for general adult medical examination without abnormal findings: Secondary | ICD-10-CM | POA: Diagnosis not present

## 2024-02-21 DIAGNOSIS — R7301 Impaired fasting glucose: Secondary | ICD-10-CM

## 2024-02-21 DIAGNOSIS — F411 Generalized anxiety disorder: Secondary | ICD-10-CM

## 2024-02-21 DIAGNOSIS — F900 Attention-deficit hyperactivity disorder, predominantly inattentive type: Secondary | ICD-10-CM

## 2024-02-21 NOTE — Patient Instructions (Signed)
 It was nice to see you today,  We addressed the following topics today: -I am going to order some blood test and let you know the results over MyChart when I get them   Have a great day,  Etha Henle, MD

## 2024-02-21 NOTE — Progress Notes (Signed)
   Annual physical  Subjective   Patient ID: Tyrone Carpenter, male    DOB: [birth year]  Age: 23 y.o. MRN: [id number]  Chief Complaint  Patient presents with   Annual Exam   HPI Daily is a 23 y.o. old male here  for annual exam.   Subjective - Denies any new concerns or changes since last visit - Currently taking Strattera  and sertraline  daily - Takes pantoprazole  as needed for stomach issues - Has Zofran  for severe nausea but hasn't needed it recently  Medications Strattera  daily, sertraline  daily, pantoprazole  as needed, Zofran  as needed for severe nausea but not recently used  PMH, PSH, FH, Social Hx Working as a Barrister's clerk, previously attended [employer] a long time ago, in a relationship with same partner, positive for tobacco and alcohol use   The ASCVD Risk score (Arnett DK, et al., 2019) failed to calculate for the following reasons:   The 2019 ASCVD risk score is only valid for ages 6 to 41  Health Maintenance Due  Topic Date Due   COVID-19 Vaccine (1) Never done   HPV VACCINES (1 - Male 3-dose series) Never done   HIV Screening  Never done   Meningococcal B Vaccine (1 of 2 - Standard) Never done   Hepatitis C Screening  Never done   DTaP/Tdap/Td (1 - Tdap) Never done      Objective:     BP 108/74   Pulse 94   Ht 5' 11 (1.803 m)   Wt 161 lb 1.9 oz (73.1 kg)   SpO2 94%   BMI 22.47 kg/m    Physical Exam Gen: alert oriented Heent: perrla, eomi Cv: rrr Pulm: lctab Gi: soft, nbs Msk: normal gait Psych: pleasant affect, spontaneous speech, good eye contact   No results found for any visits on 02/21/24.      Assessment & Plan:   Physical exam, annual  Impaired fasting glucose -     Hemoglobin A1c -     Basic metabolic panel with GFR -     Lipid panel  ADHD (attention deficit hyperactivity disorder), inattentive type Assessment & Plan: Continue strattera .     Generalized anxiety disorder Assessment & Plan: Continue sertraline  100mg   daily      Return in about 1 year (around 02/20/2025) for physical.    Tyrone Pintos, MD

## 2024-02-21 NOTE — Assessment & Plan Note (Signed)
 Continue sertraline.  100 mg daily

## 2024-02-21 NOTE — Assessment & Plan Note (Signed)
 Continue strattera.

## 2024-02-22 ENCOUNTER — Ambulatory Visit: Payer: Self-pay | Admitting: Family Medicine

## 2024-02-22 LAB — LIPID PANEL
Chol/HDL Ratio: 2.7 ratio (ref 0.0–5.0)
Cholesterol, Total: 101 mg/dL (ref 100–199)
HDL: 37 mg/dL — ABNORMAL LOW (ref >39)
LDL Chol Calc (NIH): 48 mg/dL (ref 0–99)
Triglycerides: 75 mg/dL (ref 0–149)
VLDL Cholesterol Cal: 16 mg/dL (ref 5–40)

## 2024-02-22 LAB — BASIC METABOLIC PANEL WITH GFR
BUN/Creatinine Ratio: 9 (ref 9–20)
BUN: 7 mg/dL (ref 6–20)
CO2: 23 mmol/L (ref 20–29)
Calcium: 9.7 mg/dL (ref 8.7–10.2)
Chloride: 100 mmol/L (ref 96–106)
Creatinine, Ser: 0.78 mg/dL (ref 0.76–1.27)
Glucose: 89 mg/dL (ref 70–99)
Potassium: 3.9 mmol/L (ref 3.5–5.2)
Sodium: 142 mmol/L (ref 134–144)
eGFR: 130 mL/min/{1.73_m2} (ref >59)

## 2024-02-22 LAB — HEMOGLOBIN A1C
Est. average glucose Bld gHb Est-mCnc: 103 mg/dL
Hgb A1c MFr Bld: 5.2 % (ref 4.8–5.6)

## 2024-04-28 ENCOUNTER — Other Ambulatory Visit: Payer: Self-pay | Admitting: Family Medicine

## 2024-08-27 ENCOUNTER — Telehealth: Payer: Self-pay

## 2024-08-27 ENCOUNTER — Ambulatory Visit (INDEPENDENT_AMBULATORY_CARE_PROVIDER_SITE_OTHER): Admitting: Family Medicine

## 2024-08-27 ENCOUNTER — Encounter: Payer: Self-pay | Admitting: Family Medicine

## 2024-08-27 VITALS — BP 116/76 | HR 95 | Ht 71.0 in | Wt 148.8 lb

## 2024-08-27 DIAGNOSIS — R6889 Other general symptoms and signs: Secondary | ICD-10-CM | POA: Diagnosis not present

## 2024-08-27 MED ORDER — OSELTAMIVIR PHOSPHATE 75 MG PO CAPS: 75.0000 mg | ORAL_CAPSULE | Freq: Two times a day (BID) | ORAL | 0 refills | Status: AC

## 2024-08-27 MED ORDER — PROMETHAZINE-DM 6.25-15 MG/5ML PO SYRP: 5.0000 mL | ORAL_SOLUTION | Freq: Four times a day (QID) | ORAL | 0 refills | Status: AC | PRN

## 2024-08-27 NOTE — Patient Instructions (Signed)
 It was nice to see you today,  We addressed the following topics today: For your symptoms I would like you to do the following:  Alternate 1000mg  of tylenol and 600mg  of ibuprofen every 4 hours.  Use nasal afrin and nasal saline spray for nasal congestion Use cepacol for sore throat.   I have sent in a prescription for tamiflu  to take twice a day and cough medicine to take as needed.    Have a great day,  Rolan Slain, MD

## 2024-08-27 NOTE — Telephone Encounter (Signed)
 Would you like him to come for nurse visit to get tested for Flu?

## 2024-08-27 NOTE — Telephone Encounter (Signed)
 Copied from CRM #[id number]. Topic: Appointments - Scheduling Inquiry for Clinic >> Aug 27, 2024  9:42 AM Olam RAMAN wrote: Reason for CRM: Pt has been sick since Saturdayl. May have the flu. Gma on the line and would like a cb for an appt today CB: [phone] Home line would be better

## 2024-08-27 NOTE — Assessment & Plan Note (Signed)
 Symptoms include rhinorrhea, cephalgia, myalgia, fever, productive cough, and sneezing. No gastrointestinal symptoms. Over-the-counter medications and ibuprofen have been used for relief. - Continue ibuprofen for myalgia and cephalgia. - afrin and Flonase nasal spray for congestion. - Provided a doctor's note for work.  - sent in rx for tamiflu  - our machine poc flu testing was unavailable.  Treated empirically based off symptoms -  sent promethazine -dm cough syrup

## 2024-08-27 NOTE — Telephone Encounter (Signed)
 He can come in at the 250 slot

## 2024-08-27 NOTE — Progress Notes (Signed)
 "  Established Patient Office Visit  Subjective   Patient ID: Tyrone Carpenter, male    DOB: [birth year]  Age: 23 y.o. MRN: [id number]  Chief Complaint  Patient presents with   Flu Like Symptoms      History of Present Illness   Tyrone Carpenter is a 23 year old male who presents with fever, cough, and body aches.  Symptoms began on Sunday with rhinorrhea and a sensation of a 'full, heavy' head, leading to difficulty sleeping due to tossing and turning. By Monday, he developed a fever of approximately 100.3 to 100.48F, along with severe myalgia, particularly in his back and legs, rated as a 9 out of 10 in severity. He describes feeling like he was 'n[age] years old' and was unable to ambulate due to the pain.  He has been experiencing persistent coughing and sneezing, described as feeling like 'sneezing glass.' The cough is severe enough to cause dyspnea and nearly induces emesis. No nausea, vomiting, or diarrhea.  He has been using over-the-counter medications including DayQuil, NyQuil, and ibuprofen for symptom relief. He also uses Flonase nasal spray in the mornings and evenings. Despite these treatments, symptoms were particularly severe yesterday, with some improvement today.  His sinuses have been alternating between being congested and runny, with mucus that is green, yellow, blue, and white.  He mentions recent exposure to individuals with mild cold symptoms, including family members and coworkers, but none with symptoms as severe as his.        The ASCVD Risk score (Arnett DK, et al., 2019) failed to calculate for the following reasons:   The 2019 ASCVD risk score is only valid for ages 58 to 65   * - Cholesterol units were assumed  Health Maintenance Due  Topic Date Due   COVID-19 Vaccine (1) Never done   HPV VACCINES (1 - Male 3-dose series) Never done   HIV Screening  Never done   Meningococcal B Vaccine (1 of 2 - Standard) Never done   Hepatitis C Screening  Never done    DTaP/Tdap/Td (1 - Tdap) Never done   Hepatitis B Vaccines 19-59 Average Risk (1 of 3 - 19+ 3-dose series) Never done      Objective:     BP 116/76   Pulse 95   Ht 5' 11 (1.803 m)   Wt 148 lb 12 oz (67.5 kg)   SpO2 97%   BMI 20.75 kg/m    Physical Exam   Gen: alert, oriented Heent: ttp over the frontal sinus Cv: rrr, no murmur Pulm: mildly ronchorous  Psych: pleasant affect       No results found for any visits on 08/27/24.      Assessment & Plan:   Flu-like symptoms Assessment & Plan: Symptoms include rhinorrhea, cephalgia, myalgia, fever, productive cough, and sneezing. No gastrointestinal symptoms. Over-the-counter medications and ibuprofen have been used for relief. - Continue ibuprofen for myalgia and cephalgia. - afrin and Flonase nasal spray for congestion. - Provided a doctor's note for work.  - sent in rx for tamiflu  - our machine poc flu testing was unavailable.  Treated empirically based off symptoms -  sent promethazine -dm cough syrup   Other orders -     Oseltamivir  Phosphate; Take 1 capsule (75 mg total) by mouth 2 (two) times daily for 5 days.  Dispense: 10 capsule; Refill: 0 -     Promethazine -DM; Take 5 mLs by mouth 4 (four) times daily as needed for cough.  Dispense: 118 mL; Refill:  0     Return if symptoms worsen or fail to improve.    [name], MD   "

## 2024-09-10 ENCOUNTER — Ambulatory Visit: Payer: Self-pay

## 2024-09-10 NOTE — Telephone Encounter (Signed)
 FYI Only or Action Required?: FYI only for provider: appointment scheduled on 1/7.  Patient was last seen in primary care on 08/27/2024 by Chandra Toribio POUR, MD.  Called Nurse Triage reporting Sinusitis.  Symptoms began 12/23.  Interventions attempted: Prescription medications: tamiflu , mucinex.  Symptoms are: unchanged.  Triage Disposition: See PCP When Office is Open (Within 3 Days)  Patient/caregiver understands and will follow disposition?: Yes, will follow disposition  Copied from CRM #[id number]. Topic: Clinical - Red Word Triage >> Sep 10, 2024  3:41 PM Tyrone Carpenter wrote: Red Word that prompted transfer to Nurse Triage: Patient was diagnosed with the flu before Christmas took the medication but patient still has a lot of sinus congestion, Blowing out a lot of discolored mucous and OTC are not helping. Reason for Disposition  [1] Sinus congestion (pressure, fullness) AND [2] present > 10 days  Answer Assessment - Initial Assessment Questions 1. LOCATION: Where does it hurt?      denies 2. ONSET: When did the sinus pain start?  (e.g., hours, days)      12/23 3. SEVERITY: How bad is the pain?   (Scale 0-10; or none, mild, moderate or severe)     Denies pain 5. NASAL CONGESTION: Is the nose blocked? If Yes, ask: Can you open it or must you breathe through your mouth?     Yes with discolored mucus 6. NASAL DISCHARGE: Do you have discharge from your nose? If so ask, What color?     Green/yellow 7. FEVER: Do you have a fever? If Yes, ask: What is it, how was it measured, and when did it start?      denies 8. OTHER SYMPTOMS: Do you have any other symptoms? (e.g., sore throat, cough, earache, difficulty breathing)     Cough-mild  Protocols used: Sinus Pain or Congestion-A-AH

## 2024-09-11 ENCOUNTER — Encounter: Payer: Self-pay | Admitting: Family Medicine

## 2024-09-11 ENCOUNTER — Ambulatory Visit: Admitting: Family Medicine

## 2024-09-11 VITALS — BP 91/57 | HR 87 | Ht 71.0 in | Wt 146.4 lb

## 2024-09-11 DIAGNOSIS — J0110 Acute frontal sinusitis, unspecified: Secondary | ICD-10-CM | POA: Insufficient documentation

## 2024-09-11 MED ORDER — DOXYCYCLINE HYCLATE 100 MG PO TABS: 100.0000 mg | ORAL_TABLET | Freq: Two times a day (BID) | ORAL | 0 refills | Status: AC

## 2024-09-11 NOTE — Patient Instructions (Signed)
" °  VISIT SUMMARY: You visited us  today due to persistent nasal congestion and sinus pressure. We discussed your symptoms and treatment options, and we have a plan to help you feel better.  YOUR PLAN: ACUTE SINUSITIS: You have persistent nasal congestion and sinus tension, which may be due to a viral or bacterial infection. -Take doxycycline  100 mg twice daily for 5 days. Make sure to take it with food if it causes an upset stomach. -Continue using nasal saline spray as needed. -Resume using afrin nasal spray after a few days.      "

## 2024-09-11 NOTE — Assessment & Plan Note (Signed)
 Persistent nasal congestion and sinus tension with intermittent improvement. Differential includes viral versus bacterial sinusitis. No recent use of Atrovent nasal spray. - Prescribed doxycycline  100 mg twice daily for 5-7 days. - Advised continued use of nasal saline spray as needed. - Recommended resuming Atrovent nasal spray after a few days. - Instructed to take doxycycline  with food if it causes upset stomach.

## 2024-09-11 NOTE — Progress Notes (Signed)
" ° °  Established Patient Office Visit  Subjective   Patient ID: Tyrone Carpenter, male    DOB: 2001/05/30  Age: 24 y.o. MRN: [id number]  Chief Complaint  Patient presents with   Nasal Congestion     History of Present Illness   Tyrone Carpenter is a 24 year old male who presents with persistent nasal congestion and sinus pressure.  He experiences persistent nasal congestion and sinus pressure, primarily located in the middle of his head, described as 'tense' with thick nasal discharge. Symptoms occur intermittently, with some days starting well but worsening as the day progresses. Over-the-counter medications such as DayQuil and NyQuil have been used, with NyQuil being more effective for sleep. He also uses Flonase regularly and has used Atrovent nasal spray, with a recent two-day break from it.  He experiences a cough that is less severe than his nasal symptoms, occurring every few hours with minor expectoration. No significant worsening of the cough is noted.  He has a known allergy to amoxicillin, which previously caused a rash. He has not taken any cephalosporins or other penicillins since then.  He mentions staying away from his grandparents during Christmas due to his symptoms, indicating concern for their health.          The ASCVD Risk score (Arnett DK, et al., 2019) failed to calculate for the following reasons:   The 2019 ASCVD risk score is only valid for ages 43 to 15   * - Cholesterol units were assumed  Health Maintenance Due  Topic Date Due   COVID-19 Vaccine (1) Never done   HPV VACCINES (1 - Male 3-dose series) Never done   HIV Screening  Never done   Meningococcal B Vaccine (1 of 2 - Standard) Never done   Hepatitis C Screening  Never done   DTaP/Tdap/Td (1 - Tdap) Never done   Hepatitis B Vaccines 19-59 Average Risk (1 of 3 - 19+ 3-dose series) Never done      Objective:     BP (!) 91/57   Pulse 87   Ht 5' 11 (1.803 m)   Wt 146 lb 6.4 oz (66.4 kg)   SpO2 96%    BMI 20.42 kg/m    Physical Exam     Gen: alert, oriented Heent: congested sounding Pulm: no respiratory distress Psych: pleasant affect       No results found for any visits on 09/11/24.      Assessment & Plan:   Acute non-recurrent frontal sinusitis Assessment & Plan: Persistent nasal congestion and sinus tension with intermittent improvement. Differential includes viral versus bacterial sinusitis. No recent use of Atrovent nasal spray. - Prescribed doxycycline  100 mg twice daily for 5-7 days. - Advised continued use of nasal saline spray as needed. - Recommended resuming Atrovent nasal spray after a few days. - Instructed to take doxycycline  with food if it causes upset stomach.   Other orders -     Doxycycline  Hyclate; Take 1 tablet (100 mg total) by mouth 2 (two) times daily for 5 days.  Dispense: 10 tablet; Refill: 0      Return if symptoms worsen or fail to improve.    [name], MD  "

## 2025-02-21 ENCOUNTER — Encounter: Admitting: Family Medicine
